# Patient Record
Sex: Female | Born: 1950 | Race: White | Hispanic: No | Marital: Married | Smoking: Former smoker
Health system: Southern US, Community
[De-identification: ages and names within clinical notes are randomized; demographics above are authoritative.]

## PROBLEM LIST (undated history)

## (undated) DIAGNOSIS — I10 Essential (primary) hypertension: Secondary | ICD-10-CM

## (undated) DIAGNOSIS — R011 Cardiac murmur, unspecified: Secondary | ICD-10-CM

## (undated) DIAGNOSIS — Z8489 Family history of other specified conditions: Secondary | ICD-10-CM

## (undated) DIAGNOSIS — G709 Myoneural disorder, unspecified: Secondary | ICD-10-CM

## (undated) DIAGNOSIS — R51 Headache: Secondary | ICD-10-CM

## (undated) DIAGNOSIS — Z87442 Personal history of urinary calculi: Secondary | ICD-10-CM

## (undated) DIAGNOSIS — E785 Hyperlipidemia, unspecified: Secondary | ICD-10-CM

## (undated) HISTORY — PX: INCONTINENCE SURGERY: SHX676

## (undated) HISTORY — DX: Essential (primary) hypertension: I10

## (undated) HISTORY — DX: Cardiac murmur, unspecified: R01.1

## (undated) HISTORY — PX: TUBAL LIGATION: SHX77

## (undated) HISTORY — PX: UPPER GI ENDOSCOPY: SHX6162

## (undated) HISTORY — PX: FOOT SURGERY: SHX648

## (undated) HISTORY — DX: Hyperlipidemia, unspecified: E78.5

---

## 1998-02-01 ENCOUNTER — Other Ambulatory Visit: Admission: RE | Admit: 1998-02-01 | Discharge: 1998-02-01 | Payer: Self-pay | Admitting: Obstetrics & Gynecology

## 1999-04-04 ENCOUNTER — Other Ambulatory Visit: Admission: RE | Admit: 1999-04-04 | Discharge: 1999-04-04 | Payer: Self-pay | Admitting: Obstetrics & Gynecology

## 2000-06-15 ENCOUNTER — Other Ambulatory Visit: Admission: RE | Admit: 2000-06-15 | Discharge: 2000-06-15 | Payer: Self-pay | Admitting: Obstetrics & Gynecology

## 2001-12-28 ENCOUNTER — Encounter (HOSPITAL_BASED_OUTPATIENT_CLINIC_OR_DEPARTMENT_OTHER): Payer: Self-pay | Admitting: Internal Medicine

## 2001-12-28 ENCOUNTER — Ambulatory Visit (HOSPITAL_COMMUNITY): Admission: RE | Admit: 2001-12-28 | Discharge: 2001-12-28 | Payer: Self-pay | Admitting: Internal Medicine

## 2002-01-05 ENCOUNTER — Encounter (INDEPENDENT_AMBULATORY_CARE_PROVIDER_SITE_OTHER): Payer: Self-pay | Admitting: *Deleted

## 2002-01-05 ENCOUNTER — Ambulatory Visit (HOSPITAL_COMMUNITY): Admission: RE | Admit: 2002-01-05 | Discharge: 2002-01-05 | Payer: Self-pay | Admitting: Internal Medicine

## 2002-01-05 ENCOUNTER — Encounter (HOSPITAL_BASED_OUTPATIENT_CLINIC_OR_DEPARTMENT_OTHER): Payer: Self-pay | Admitting: Internal Medicine

## 2002-11-23 ENCOUNTER — Other Ambulatory Visit: Admission: RE | Admit: 2002-11-23 | Discharge: 2002-11-23 | Payer: Self-pay | Admitting: Obstetrics & Gynecology

## 2003-12-04 ENCOUNTER — Other Ambulatory Visit: Admission: RE | Admit: 2003-12-04 | Discharge: 2003-12-04 | Payer: Self-pay | Admitting: Obstetrics & Gynecology

## 2004-12-04 ENCOUNTER — Other Ambulatory Visit: Admission: RE | Admit: 2004-12-04 | Discharge: 2004-12-04 | Payer: Self-pay | Admitting: Obstetrics & Gynecology

## 2008-01-19 HISTORY — PX: CARDIOVASCULAR STRESS TEST: SHX262

## 2010-03-19 ENCOUNTER — Ambulatory Visit (HOSPITAL_COMMUNITY): Admission: RE | Admit: 2010-03-19 | Discharge: 2010-03-19 | Payer: Self-pay | Admitting: Obstetrics and Gynecology

## 2010-06-13 ENCOUNTER — Encounter: Admission: RE | Admit: 2010-06-13 | Discharge: 2010-06-13 | Payer: Self-pay | Admitting: Cardiovascular Disease

## 2010-10-25 LAB — BASIC METABOLIC PANEL
BUN: 16 mg/dL (ref 6–23)
CO2: 29 mEq/L (ref 19–32)
Calcium: 10.3 mg/dL (ref 8.4–10.5)
Chloride: 104 mEq/L (ref 96–112)
Creatinine, Ser: 0.56 mg/dL (ref 0.4–1.2)
GFR calc Af Amer: >60 mL/min (ref >60)
GFR calc non Af Amer: >60 mL/min (ref >60)
Glucose, Bld: 132 mg/dL — ABNORMAL HIGH (ref 70–99)
Potassium: 4.3 mEq/L (ref 3.5–5.1)
Sodium: 138 mEq/L (ref 135–145)

## 2010-10-25 LAB — CBC
HCT: 43 % (ref 36.0–46.0)
Hemoglobin: 14.1 g/dL (ref 12.0–15.0)
MCH: 30.5 pg (ref 26.0–34.0)
MCHC: 32.9 g/dL (ref 30.0–36.0)
MCV: 92.7 fL (ref 78.0–100.0)
Platelets: 359 10*3/uL (ref 150–400)
RBC: 4.64 MIL/uL (ref 3.87–5.11)
RDW: 14.4 % (ref 11.5–15.5)
WBC: 8.6 10*3/uL (ref 4.0–10.5)

## 2010-10-25 LAB — URINALYSIS, ROUTINE W REFLEX MICROSCOPIC
Bilirubin Urine: NEGATIVE
Glucose, UA: NEGATIVE mg/dL
Hgb urine dipstick: NEGATIVE
Ketones, ur: NEGATIVE mg/dL
Nitrite: NEGATIVE
Protein, ur: NEGATIVE mg/dL
Specific Gravity, Urine: 1.01 (ref 1.005–1.030)
Urobilinogen, UA: 0.2 mg/dL (ref 0.0–1.0)
pH: 6 (ref 5.0–8.0)

## 2010-10-25 LAB — SURGICAL PCR SCREEN: MRSA, PCR: NEGATIVE

## 2011-07-01 ENCOUNTER — Other Ambulatory Visit: Payer: Self-pay | Admitting: Internal Medicine

## 2011-07-02 ENCOUNTER — Ambulatory Visit
Admission: RE | Admit: 2011-07-02 | Discharge: 2011-07-02 | Disposition: A | Discharge status: Home / Self Care | Payer: Commercial Managed Care - PPO | Source: Ambulatory Visit | Attending: Internal Medicine | Admitting: Internal Medicine

## 2012-05-27 HISTORY — PX: TRANSTHORACIC ECHOCARDIOGRAM: SHX275

## 2013-05-25 ENCOUNTER — Ambulatory Visit: Payer: Commercial Managed Care - PPO | Admitting: Cardiovascular Disease

## 2013-05-31 ENCOUNTER — Encounter: Payer: Self-pay | Admitting: Cardiovascular Disease

## 2013-05-31 ENCOUNTER — Ambulatory Visit (INDEPENDENT_AMBULATORY_CARE_PROVIDER_SITE_OTHER): Payer: Commercial Managed Care - PPO | Admitting: Cardiovascular Disease

## 2013-05-31 VITALS — BP 130/90 | Ht 64.5 in | Wt 167.3 lb

## 2013-05-31 DIAGNOSIS — I2510 Atherosclerotic heart disease of native coronary artery without angina pectoris: Secondary | ICD-10-CM

## 2013-05-31 DIAGNOSIS — I10 Essential (primary) hypertension: Secondary | ICD-10-CM

## 2013-05-31 DIAGNOSIS — E8881 Metabolic syndrome: Secondary | ICD-10-CM

## 2013-05-31 DIAGNOSIS — E785 Hyperlipidemia, unspecified: Secondary | ICD-10-CM

## 2013-05-31 DIAGNOSIS — K219 Gastro-esophageal reflux disease without esophagitis: Secondary | ICD-10-CM

## 2013-05-31 NOTE — Patient Instructions (Signed)
Your physician recommends that you return for lab work fasting.  Your physician recommends that you schedule a follow-up appointment in:6 MONTHS. 

## 2013-06-01 ENCOUNTER — Encounter: Payer: Self-pay | Admitting: Cardiovascular Disease

## 2013-06-01 LAB — COMPREHENSIVE METABOLIC PANEL
AST: 19 U/L (ref 0–37)
Albumin: 4.4 g/dL (ref 3.5–5.2)
Alkaline Phosphatase: 81 U/L (ref 39–117)
BUN: 12 mg/dL (ref 6–23)
Potassium: 4.5 mEq/L (ref 3.5–5.3)
Total Bilirubin: 0.6 mg/dL (ref 0.3–1.2)

## 2013-06-01 LAB — NMR LIPOPROFILE WITH LIPIDS
HDL Size: 8.3 nm — ABNORMAL LOW (ref >=9.2)
HDL-C: 43 mg/dL (ref >=40)
LDL Particle Number: 1784 nmol/L — ABNORMAL HIGH (ref <1000)
LDL Size: 20 nm — ABNORMAL LOW (ref >20.5)
Large VLDL-P: 8 nmol/L — ABNORMAL HIGH (ref <=2.7)
Triglycerides: 208 mg/dL — ABNORMAL HIGH (ref <150)
VLDL Size: 51.6 nm — ABNORMAL HIGH (ref <=46.6)

## 2013-06-01 MED ORDER — PANTOPRAZOLE SODIUM 40 MG PO TBEC: 40.0000 mg | DELAYED_RELEASE_TABLET | Freq: Every day | ORAL | Status: DC

## 2013-06-01 MED ORDER — VALSARTAN 80 MG PO TABS: 80.0000 mg | ORAL_TABLET | Freq: Every day | ORAL | Status: DC

## 2013-06-01 MED ORDER — ATORVASTATIN CALCIUM 20 MG PO TABS: 20.0000 mg | ORAL_TABLET | Freq: Every day | ORAL | Status: DC

## 2013-06-06 ENCOUNTER — Encounter: Payer: Self-pay | Admitting: Cardiovascular Disease

## 2013-06-16 ENCOUNTER — Other Ambulatory Visit: Payer: Self-pay

## 2013-06-27 ENCOUNTER — Encounter: Payer: Self-pay | Admitting: Cardiovascular Disease

## 2013-06-27 DIAGNOSIS — I10 Essential (primary) hypertension: Secondary | ICD-10-CM | POA: Insufficient documentation

## 2013-06-27 DIAGNOSIS — E8881 Metabolic syndrome: Secondary | ICD-10-CM | POA: Insufficient documentation

## 2013-06-27 DIAGNOSIS — E785 Hyperlipidemia, unspecified: Secondary | ICD-10-CM | POA: Insufficient documentation

## 2013-06-27 DIAGNOSIS — K219 Gastro-esophageal reflux disease without esophagitis: Secondary | ICD-10-CM | POA: Insufficient documentation

## 2013-06-27 NOTE — Progress Notes (Signed)
Patient ID: Chelsea Mays, female   DOB: September 12, 1950, 62 y.o.   MRN: [id number]     PATIENT PROFILE:  [name] is a 62 year old presents to the office to establish cardiological care with me. She is a  former patient of Dr. Susa Mays and the wife of Mr. Chelsea Mays.   HPI: [name] has a history of hyperlipidemia, mild hypertension, and possible metabolic syndrome. She sees Dr. Brunilda Mays for primary medical care. In 2009 she underwent a nuclear perfusion study to evaluate for ischemia which was having atypical chest pain. This revealed normal perfusion. A 2-D echo Doppler study in October 2013 showed normal left ventricular size and low normal function with an ejection fraction of 50-55%. She had grade 1 diastolic dysfunction. There was evidence for mild mitral annular calcification with mild mitral regurgitation. Aortic valve is mildly sclerotic but there was no evidence for stenosis. She also has a history of GERD. She last saw Dr. Alanda Mays in April 2014. Laboratory data time showed a glucose of 110. Abnormal LFTs. Calcium was upper normal at 10.3. In October 2013, her lipids were notable for a total cholesterol that was 176, triglycerides were elevated at 181, HDL 41, and LDL cholesterol 99.  She denies any recent chest pain. She denies any presyncope or palpitations. She presents to the office today to establish cardiologic care with me.  Past Medical History  Diagnosis Date  . Hyperlipidemia   . Hypertension   . Murmur     Past Surgical History  Procedure Laterality Date  . Cardiovascular stress test  01/19/2008    Non specific T-wave changes, normal scan  . Transthoracic echocardiogram  05/27/2012    EF 50-55%, normal-mild    No Known Allergies  Current Outpatient Prescriptions  Medication Sig Dispense Refill  . aspirin EC 81 MG tablet Take 81 mg by mouth daily.      Marland Kitchen loratadine (CLARITIN) 10 MG tablet Take 10 mg by mouth as needed for allergies.        . Multiple Vitamin (MULTIVITAMIN) tablet Take 1 tablet by mouth daily.      . sertraline (ZOLOFT) 25 MG tablet Take 25 mg by mouth daily.      Marland Kitchen atorvastatin (LIPITOR) 20 MG tablet Take 1 tablet (20 mg total) by mouth daily.  90 tablet  3  . pantoprazole (PROTONIX) 40 MG tablet Take 1 tablet (40 mg total) by mouth daily.  90 tablet  3  . valsartan (DIOVAN) 80 MG tablet Take 1 tablet (80 mg total) by mouth daily.  90 tablet  3   No current facility-administered medications for this visit.    Social history is notable in that she is married for 31 years. She has one child 2 grandchildren. 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 There is a remote tobacco history she quit in August 2005. She does drink approximately 3-5 drinks of wine per week. She does exercise fairly regularly typically doing walking and yard work.  Family History  Problem Relation Age of Onset  . Alzheimer's disease Mother 78  . Heart attack Father 58  . Thyroid disease Brother     ROS is negative for fever chills or night sweats. She denies any skin rash or changes. She denies visual symptoms. She denies cough or sputum production. She denies palpitations presyncope or syncope. There is no chest pressure. She does have a history of mild GERD. She denies nausea vomiting diarrhea.  She denies blood in her stool or urine. There is no endocrine abnormalities. She's unaware of diabetes or thyroid disease. She denies claudication. There is no edema. She denies neurologic symptoms. Other comprehensive 14 point system review is negative.  PE BP 130/90  Ht 5' 4.5" (1.638 m)  Wt 167 lb 4.8 oz (75.887 kg)  BMI 28.28 kg/m2 Repeat blood pressure by me was 126/85. General: Alert, oriented, no distress.  Skin: normal turgor, no rashes; left arm tattoo and a tattoo above her right groin region. HEENT: Normocephalic, atraumatic. Pupils round and reactive; sclera anicteric; Fundi without hemorrhages  or exudates the Nose without nasal septal hypertrophy Mouth/Parynx benign; Mallinpatti scale 3 Neck: No JVD, no carotid bruits Lungs: clear to ausculatation and percussion; no wheezing or rales Heart: RRR, s1 s2 normal 1/6 systolic murmur. There was no rub or click. Abdomen: soft, nontender; no hepatosplenomehaly, BS+; abdominal aorta nontender and not dilated by palpation. Pulses 2+ Extremities: no clubbinbg cyanosis or edema, Homan's sign negative  Neurologic: grossly nonfocal Psychologic: Normal mood and affect   ECG: Normal sinus rhythm at 63 beats per minute period. We'll 158 ms, QTc interval 444 ms. No significant ST changes.  LABS:  BMET    Component Value Date/Time   NA 139 05/31/2013 1117   K 4.5 05/31/2013 1117   CL 104 05/31/2013 1117   CO2 27 05/31/2013 1117   GLUCOSE 101* 05/31/2013 1117   BUN 12 05/31/2013 1117   CREATININE 0.60 05/31/2013 1117   CREATININE 0.56 03/13/2010 0925   CALCIUM 10.0 05/31/2013 1117   GFRNONAA >60 03/13/2010 0925   GFRAA  Value: >60        The eGFR has been calculated using the MDRD equation. This calculation has not been validated in all clinical situations. eGFR's persistently <60 mL/min signify possible Chronic Kidney Disease. 03/13/2010 0925     Hepatic Function Panel     Component Value Date/Time   PROT 7.4 05/31/2013 1117   ALBUMIN 4.4 05/31/2013 1117   AST 19 05/31/2013 1117   ALT 18 05/31/2013 1117   ALKPHOS 81 05/31/2013 1117   BILITOT 0.6 05/31/2013 1117     CBC    Component Value Date/Time   WBC 8.6 03/13/2010 0925   RBC 4.64 03/13/2010 0925   HGB 14.1 03/13/2010 0925   HCT 43.0 03/13/2010 0925   PLT 359 03/13/2010 0925   MCV 92.7 03/13/2010 0925   MCH 30.5 03/13/2010 0925   MCHC 32.9 03/13/2010 0925   RDW 14.4 03/13/2010 0925     BNP No results found for this basename: probnp    Lipid Panel     Component Value Date/Time   TRIG 208* 05/31/2013 1117   LDLCALC 86 05/31/2013 1117     RADIOLOGY: No results  found.   ASSESSMENT AND PLAN: My impression is that [name] is a 62 year old female who has a remote tobacco history but quit smoking 9 years ago. Her blood pressure today is controlled on Diovan 80 mg daily. She is on atorvastatin 20 mg for hyperlipidemia. She's not had any significant GERD symptoms on her current dose of Protonix. Nuclear perfusion study in 2009 showed normal perfusion without evidence for scar or ischemia. Her most recent echo Doppler study one year ago did show low normal systolic function with grade 1 diastolic dysfunction. Presently, her blood pressure is well controlled. In 6 months she will undergo a progressive metabolic panel hemoglobin A1c and NMR lipoprofile for further evaluation of her laboratory and possible metabolic  syndrome. We did discuss the importance of exercise and diet.  Lennette Bihari, MD, [facility] 06/27/2013 7:19 PM

## 2013-06-28 ENCOUNTER — Encounter: Payer: Self-pay | Admitting: Cardiovascular Disease

## 2013-06-30 ENCOUNTER — Telehealth: Payer: Self-pay | Admitting: *Deleted

## 2013-06-30 MED ORDER — ATORVASTATIN CALCIUM 40 MG PO TABS: 40.0000 mg | ORAL_TABLET | Freq: Every day | ORAL | Status: DC

## 2013-06-30 NOTE — Telephone Encounter (Signed)
Informed patient of lab results. Dr. Tresa Endo wants her to increase her atorvastatin to 40 mg. Patient advised to take 2 of the 20 mg tablets until gone, then start the new prescription for the 40 mg tablets. New prescription will be sent to patient's prescription  mail order company per her request.

## 2013-11-01 ENCOUNTER — Other Ambulatory Visit: Payer: Self-pay | Admitting: Obstetrics & Gynecology

## 2014-01-16 ENCOUNTER — Other Ambulatory Visit: Payer: Self-pay | Admitting: Gastroenterology

## 2014-03-28 ENCOUNTER — Other Ambulatory Visit: Payer: Self-pay | Admitting: Cardiovascular Disease

## 2014-03-28 NOTE — Telephone Encounter (Signed)
Rx was sent to pharmacy electronically. 

## 2014-04-06 ENCOUNTER — Encounter (HOSPITAL_COMMUNITY): Payer: Self-pay | Admitting: Pharmacy Technician

## 2014-04-13 ENCOUNTER — Encounter (HOSPITAL_COMMUNITY): Payer: Self-pay | Admitting: *Deleted

## 2014-04-17 ENCOUNTER — Other Ambulatory Visit: Payer: Self-pay | Admitting: Cardiovascular Disease

## 2014-04-18 NOTE — Telephone Encounter (Signed)
Rx was sent to pharmacy electronically. 

## 2014-04-24 ENCOUNTER — Other Ambulatory Visit: Payer: Self-pay | Admitting: Gastroenterology

## 2014-04-25 ENCOUNTER — Ambulatory Visit (HOSPITAL_COMMUNITY)
Admission: RE | Admit: 2014-04-25 | Discharge: 2014-04-25 | Disposition: A | Discharge status: Home / Self Care | Payer: Commercial Managed Care - PPO | Source: Ambulatory Visit | Attending: Gastroenterology | Admitting: Gastroenterology

## 2014-04-25 ENCOUNTER — Ambulatory Visit (HOSPITAL_COMMUNITY): Payer: Commercial Managed Care - PPO | Admitting: Anesthesiology

## 2014-04-25 ENCOUNTER — Encounter (HOSPITAL_COMMUNITY): Payer: Commercial Managed Care - PPO | Admitting: Anesthesiology

## 2014-04-25 ENCOUNTER — Encounter (HOSPITAL_COMMUNITY)
Admission: RE | Disposition: A | Discharge status: Home / Self Care | Payer: Self-pay | Source: Ambulatory Visit | Attending: Gastroenterology

## 2014-04-25 ENCOUNTER — Encounter (HOSPITAL_COMMUNITY): Admission: RE | Payer: Self-pay | Source: Ambulatory Visit

## 2014-04-25 ENCOUNTER — Other Ambulatory Visit: Payer: Self-pay | Admitting: Gastroenterology

## 2014-04-25 ENCOUNTER — Ambulatory Visit (HOSPITAL_COMMUNITY)
Admission: RE | Admit: 2014-04-25 | Payer: Commercial Managed Care - PPO | Source: Ambulatory Visit | Admitting: Gastroenterology

## 2014-04-25 ENCOUNTER — Encounter (HOSPITAL_COMMUNITY): Payer: Self-pay | Admitting: *Deleted

## 2014-04-25 DIAGNOSIS — E7800 Pure hypercholesterolemia, unspecified: Secondary | ICD-10-CM | POA: Insufficient documentation

## 2014-04-25 DIAGNOSIS — Z87891 Personal history of nicotine dependence: Secondary | ICD-10-CM | POA: Insufficient documentation

## 2014-04-25 DIAGNOSIS — K5730 Diverticulosis of large intestine without perforation or abscess without bleeding: Secondary | ICD-10-CM | POA: Diagnosis not present

## 2014-04-25 DIAGNOSIS — I10 Essential (primary) hypertension: Secondary | ICD-10-CM | POA: Insufficient documentation

## 2014-04-25 DIAGNOSIS — K219 Gastro-esophageal reflux disease without esophagitis: Secondary | ICD-10-CM | POA: Insufficient documentation

## 2014-04-25 DIAGNOSIS — Z1211 Encounter for screening for malignant neoplasm of colon: Secondary | ICD-10-CM | POA: Diagnosis not present

## 2014-04-25 HISTORY — DX: Myoneural disorder, unspecified: G70.9

## 2014-04-25 HISTORY — DX: Personal history of urinary calculi: Z87.442

## 2014-04-25 HISTORY — DX: Headache: R51

## 2014-04-25 HISTORY — DX: Family history of other specified conditions: Z84.89

## 2014-04-25 HISTORY — PX: COLONOSCOPY WITH PROPOFOL: SHX5780

## 2014-04-25 SURGERY — COLONOSCOPY WITH PROPOFOL
Anesthesia: Monitor Anesthesia Care

## 2014-04-25 MED ORDER — PROPOFOL 10 MG/ML IV BOLUS
INTRAVENOUS | Status: AC
  Filled 2014-04-25: qty 20

## 2014-04-25 MED ORDER — ONDANSETRON HCL 4 MG/2ML IJ SOLN
INTRAMUSCULAR | Status: AC
  Filled 2014-04-25: qty 2

## 2014-04-25 MED ORDER — LACTATED RINGERS IV SOLN
INTRAVENOUS | Status: DC
  Administered 2014-04-25: 1000 mL via INTRAVENOUS

## 2014-04-25 MED ORDER — LIDOCAINE HCL (CARDIAC) 20 MG/ML IV SOLN
INTRAVENOUS | Status: DC | PRN
  Administered 2014-04-25: 100 mg via INTRAVENOUS

## 2014-04-25 MED ORDER — ONDANSETRON HCL 4 MG/2ML IJ SOLN
INTRAMUSCULAR | Status: DC | PRN
  Administered 2014-04-25: 4 mg via INTRAVENOUS

## 2014-04-25 MED ORDER — LIDOCAINE HCL (CARDIAC) 20 MG/ML IV SOLN
INTRAVENOUS | Status: AC
  Filled 2014-04-25: qty 5

## 2014-04-25 MED ORDER — PROPOFOL INFUSION 10 MG/ML OPTIME
INTRAVENOUS | Status: DC | PRN
  Administered 2014-04-25: 300 ug/kg/min via INTRAVENOUS

## 2014-04-25 MED ORDER — KETAMINE HCL 10 MG/ML IJ SOLN
INTRAMUSCULAR | Status: DC | PRN
  Administered 2014-04-25: 20 mg via INTRAVENOUS

## 2014-04-25 SURGICAL SUPPLY — 22 items

## 2014-04-25 NOTE — H&P (Signed)
  Procedure: Screening colonoscopy. Normal screening colonoscopy performed on 03/26/2006; colon prep for the exam was fair.  History: The patient is a 63 year old female born 04/15/1951. Her mother and sister have undergone colonoscopic exams to remove colon polyps. There is no family history of colon cancer.  The patient is scheduled to undergo a repeat screening colonoscopy.  Past medical history: Hypertension. Hypercholesterolemia. Seasonal allergies. Gastroesophageal reflux. Tubal ligation. Pelvic mesh-sling. Left foot surgery. Nasal fracture  Habits: The patient has a 10 year pack history of cigarette smoking. She quit smoking cigarettes 10 years ago. She consumes wine in moderation  Exam: The patient is alert and lying comfortably on the endoscopy stretcher. Abdomen is soft and nontender to palpation. Lungs are clear to auscultation. Cardiac exam reveals a regular rhythm.  Plan: Proceed with repeat screening colonoscopy

## 2014-04-25 NOTE — Anesthesia Preprocedure Evaluation (Signed)
Anesthesia Evaluation  Patient identified by MRN, date of birth, ID band Patient awake    Reviewed: Allergy & Precautions, H&P , NPO status , Patient's Chart, lab work & pertinent test results  Airway Mallampati: II TM Distance: >3 FB Neck ROM: Full    Dental no notable dental hx.    Pulmonary former smoker,  breath sounds clear to auscultation  Pulmonary exam normal       Cardiovascular hypertension, Pt. on medications + Valvular Problems/Murmurs Rhythm:Regular Rate:Normal     Neuro/Psych  Headaches, negative psych ROS   GI/Hepatic Neg liver ROS, GERD-  ,  Endo/Other  negative endocrine ROS  Renal/GU negative Renal ROS     Musculoskeletal negative musculoskeletal ROS (+)   Abdominal   Peds  Hematology negative hematology ROS (+)   Anesthesia Other Findings   Reproductive/Obstetrics negative OB ROS                           Anesthesia Physical Anesthesia Plan  ASA: II  Anesthesia Plan: MAC   Post-op Pain Management:    Induction: Intravenous  Airway Management Planned:   Additional Equipment:   Intra-op Plan:   Post-operative Plan:   Informed Consent: I have reviewed the patients History and Physical, chart, labs and discussed the procedure including the risks, benefits and alternatives for the proposed anesthesia with the patient or authorized representative who has indicated his/her understanding and acceptance.   Dental advisory given  Plan Discussed with: CRNA  Anesthesia Plan Comments:         Anesthesia Quick Evaluation

## 2014-04-25 NOTE — Op Note (Signed)
Procedure: Screening colonoscopy. Normal screening colonoscopy performed on 03/26/2006; fair colon prep for the exam.  Endoscopist: Danise Edge  Premedication: Propofol administered by anesthesia  Procedure: The patient was placed in the left lateral decubitus position. Anal inspection and digital rectal exam were normal. The Pentax pediatric colonoscope was introduced into the rectum and advanced to the cecum. A normal-appearing appendiceal orifice was identified. A normal-appearing ileocecal valve was identified. Colonic preparation for the exam today was good. Performance of today's colonoscopy was technically difficult due to colonic loop formation.  Rectum. Normal. Retroflexed view of the distal rectum normal  Sigmoid colon and descending colon. Normal  Splenic flexure. Normal  Transverse colon. Normal  Hepatic flexure. Normal  Ascending colon. Normal  Cecum and ileocecal valve. Normal  Assessment: Normal screening colonoscopy  Recommendation: Schedule repeat screening colonoscopy in 10 years

## 2014-04-25 NOTE — Transfer of Care (Signed)
Immediate Anesthesia Transfer of Care Note  Patient: Chelsea Mays  Procedure(s) Performed: Procedure(s) (LRB): COLONOSCOPY WITH PROPOFOL (N/A)  Patient Location: PACU  Anesthesia Type: MAC  Level of Consciousness: sedated, patient cooperative and responds to stimulation  Airway & Oxygen Therapy: Patient Spontanous Breathing and Patient connected to face mask oxgen  Post-op Assessment: Report given to PACU RN and Post -op Vital signs reviewed and stable  Post vital signs: Reviewed and stable  Complications: No apparent anesthesia complications

## 2014-04-25 NOTE — Anesthesia Postprocedure Evaluation (Signed)
Anesthesia Post Note  Patient: Chelsea Mays  Procedure(s) Performed: Procedure(s) (LRB): COLONOSCOPY WITH PROPOFOL (N/A)  Anesthesia type: Epidural  Patient location: PACU  Post pain: Pain level controlled  Post assessment: Post-op Vital signs reviewed  Last Vitals: BP 128/68  Pulse 67  Temp(Src) 36.8 C (Oral)  Resp 13  Ht  (1.626 m)  Wt 165 lb (74.844 kg)  BMI 28.31 kg/m2  SpO2 96%  Post vital signs: Reviewed  Level of consciousness: sedated  Complications: No apparent anesthesia complications

## 2014-04-26 ENCOUNTER — Encounter (HOSPITAL_COMMUNITY): Payer: Self-pay | Admitting: Gastroenterology

## 2014-05-10 ENCOUNTER — Ambulatory Visit (INDEPENDENT_AMBULATORY_CARE_PROVIDER_SITE_OTHER): Payer: Commercial Managed Care - PPO | Admitting: Cardiovascular Disease

## 2014-05-10 ENCOUNTER — Telehealth: Payer: Self-pay | Admitting: Cardiovascular Disease

## 2014-05-10 ENCOUNTER — Encounter: Payer: Self-pay | Admitting: Cardiovascular Disease

## 2014-05-10 VITALS — BP 124/88 | HR 64 | Ht 64.0 in | Wt 169.9 lb

## 2014-05-10 DIAGNOSIS — I10 Essential (primary) hypertension: Secondary | ICD-10-CM

## 2014-05-10 DIAGNOSIS — E8881 Metabolic syndrome: Secondary | ICD-10-CM

## 2014-05-10 DIAGNOSIS — K219 Gastro-esophageal reflux disease without esophagitis: Secondary | ICD-10-CM

## 2014-05-10 DIAGNOSIS — E785 Hyperlipidemia, unspecified: Secondary | ICD-10-CM

## 2014-05-10 LAB — LIPID PANEL
CHOL/HDL RATIO: 3.4 ratio
CHOLESTEROL: 141 mg/dL (ref 0–200)
HDL: 42 mg/dL (ref >39)
LDL Cholesterol: 72 mg/dL (ref 0–99)
Triglycerides: 135 mg/dL (ref <150)
VLDL: 27 mg/dL (ref 0–40)

## 2014-05-10 LAB — COMPREHENSIVE METABOLIC PANEL
ALT: 18 U/L (ref 0–35)
AST: 18 U/L (ref 0–37)
Albumin: 4.1 g/dL (ref 3.5–5.2)
Alkaline Phosphatase: 76 U/L (ref 39–117)
BILIRUBIN TOTAL: 0.6 mg/dL (ref 0.2–1.2)
BUN: 15 mg/dL (ref 6–23)
CALCIUM: 9.5 mg/dL (ref 8.4–10.5)
CHLORIDE: 106 meq/L (ref 96–112)
CO2: 24 meq/L (ref 19–32)
Creat: 0.59 mg/dL (ref 0.50–1.10)
GLUCOSE: 105 mg/dL — AB (ref 70–99)
Potassium: 4.7 mEq/L (ref 3.5–5.3)
Sodium: 141 mEq/L (ref 135–145)
Total Protein: 6.7 g/dL (ref 6.0–8.3)

## 2014-05-10 MED ORDER — PANTOPRAZOLE SODIUM 40 MG PO TBEC: 40.0000 mg | DELAYED_RELEASE_TABLET | Freq: Two times a day (BID) | ORAL | Status: AC

## 2014-05-10 MED ORDER — PANTOPRAZOLE SODIUM 40 MG PO TBEC: 40.0000 mg | DELAYED_RELEASE_TABLET | Freq: Two times a day (BID) | ORAL | Status: DC

## 2014-05-10 MED ORDER — PANTOPRAZOLE SODIUM 40 MG PO TBEC: 40.0000 mg | DELAYED_RELEASE_TABLET | Freq: Every day | ORAL | Status: DC

## 2014-05-10 NOTE — Progress Notes (Signed)
Patient ID: Chelsea Mays, female   DOB: [birth year], 63 y.o.   MRN: [id number]     HPI:  Chelsea Mays is a 63 year old presents to the office for one year followup cardiology evaluation.  She is a former patient of Dr. Rollene Fare.   [name] has a history of hyperlipidemia, mild hypertension, and possible metabolic syndrome. She has seen Dr. Valetta Fuller for primary medical care , but has not seen him in 3 years. In 2009 she underwent a nuclear perfusion study to evaluate for ischemia which was having atypical chest pain. This revealed normal perfusion. A 2-D echo Doppler study in October 2013 showed normal left ventricular size and low normal function with an ejection fraction of 50-55%. She had grade 1 diastolic dysfunction. There was evidence for mild mitral annular calcification with mild mitral regurgitation. Aortic valve is mildly sclerotic but there was no evidence for stenosis. She also has a history of GERD. She last saw Dr. Rollene Fare in April 2014. Laboratory data time showed a glucose of 110. Abnormal LFTs. Calcium was upper normal at 10.3. In October 2013, her lipids were notable for a total cholesterol that was 176, triglycerides were elevated at 181, HDL 41, and LDL cholesterol 99.  She established care with me one year ago.  The past year, she states her blood pressure has been stable on valsartan 80 mg daily.  She does have issues with GERD and has been taking Protonix 40 mg daily.  Recently, however, she has noticed some increased reflux symptomatology.  She underwent a colonoscopy approximately 2-3 weeks ago by Dr. Howell Rucks was told that this was normal.  Apparently, their office, and stated that if she needs an evaluation with Dr. Wynetta Emery.  Reflux.  She will need a referral.  She also notes some mild varicose veins.  She denies leg swelling.  She denies skin discoloration.  She denies lower extremity edema.  She does have a history of hyperlipidemia and has been taking  atorvastatin 40 mg daily.  Past Medical History  Diagnosis Date  . Hyperlipidemia   . Hypertension   . Murmur   . Family history of anesthesia complication     mother had hard time awaking, not patient  . History of kidney stones   . Headache(784.0)     migraines in past  . Neuromuscular disorder     past hx. sciatic nerve pain , not at present    Past Surgical History  Procedure Laterality Date  . Cardiovascular stress test  01/19/2008    Non specific T-wave changes, normal scan  . Transthoracic echocardiogram  05/27/2012    EF 50-55%, normal-mild  . Upper gi endoscopy    . Foot surgery Left     x3 different times  . Tubal ligation    . Incontinence surgery    . Colonoscopy with propofol N/A 04/25/2014    Procedure: COLONOSCOPY WITH PROPOFOL;  Surgeon: Garlan Fair, MD;  Location: [facility];  Service: Endoscopy;  Laterality: N/A;    No Known Allergies  Current Outpatient Prescriptions  Medication Sig Dispense Refill  . aspirin EC 81 MG tablet Take 81 mg by mouth daily.      Marland Kitchen atorvastatin (LIPITOR) 40 MG tablet Take 40 mg by mouth daily.      . Multiple Vitamin (MULTIVITAMIN) tablet Take 1 tablet by mouth daily.      . pantoprazole (PROTONIX) 40 MG tablet Take 40 mg by mouth daily.      Marland Kitchen  valsartan (DIOVAN) 80 MG tablet Take 1 tablet (80 mg total) by mouth daily. NEEDS APPOINTMENT FOR FUTURE REFILLS.  90 tablet  0   No current facility-administered medications for this visit.    Social history is notable in that she is married for 31 years. She has one child 2 grandchildren. 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 There is a remote tobacco history she quit in August 2005. She does drink approximately 3-5 drinks of wine per week. She does exercise fairly regularly typically doing walking and yard work.  Family History  Problem Relation Age of Onset  . Alzheimer's disease Mother 32  . Heart attack Father 53  . Thyroid  disease Brother     ROS General: Negative; No fevers, chills, or night sweats;  HEENT: Negative; No changes in vision or hearing, sinus congestion, difficulty swallowing Pulmonary: Negative; No cough, wheezing, shortness of breath, hemoptysis Cardiovascular: Negative; No chest pain, presyncope, syncope, palpitations GI: Positive for GERD No nausea, vomiting, diarrhea, or abdominal pain GU: Negative; No dysuria, hematuria, or difficulty voiding Musculoskeletal: Negative; no myalgias, joint pain, or weakness Hematologic/Oncology: Negative; no easy bruising, bleeding Endocrine: Negative; no heat/cold intolerance; no diabetes Neuro: Negative; no changes in balance, headaches Skin: Negative; No rashes or skin lesions Psychiatric: Negative; No behavioral problems, depression Sleep: Negative; No snoring, daytime sleepiness, hypersomnolence, bruxism, restless legs, hypnogognic hallucinations, no cataplexy Other comprehensive 14 point system review is negative.   PE BP 124/88  Pulse 64  Ht $R'5\' 4"'Qa$  (1.626 m)  Wt 169 lb 14.4 oz (77.066 kg)  BMI 29.15 kg/m2 Repeat blood pressure by me was 126/85. General: Alert, oriented, no distress.  Skin: normal turgor, no rashes; left arm tattoo and a tattoo above her right groin region. HEENT: Normocephalic, atraumatic. Pupils round and reactive; sclera anicteric; Fundi without hemorrhages or exudates  Nose without nasal septal hypertrophy Mouth/Parynx benign; Mallinpatti scale 3 Neck: No JVD, no carotid bruits; normal carotid upstroke Lungs: clear to ausculatation and percussion; no wheezing or rales Chest wall: Nontender to palpation Heart: RRR, s1 s2 normal 1/6 systolic murmur. There was no rub or click.  No diastolic murmur. Abdomen: soft, nontender; no hepatosplenomehaly, BS+; abdominal aorta nontender and not dilated by palpation. Back: No CVA tenderness Pulses 2+ Extremities: no clubbinbg cyanosis or edema, Homan's sign negative  Neurologic:  grossly nonfocal Psychologic: Normal mood and affect  ECG (independently read by me): Normal sinus rhythm at 64 beats per minute.  Normal intervals.  No ST changes.   October 2014 ECG: Normal sinus rhythm at 63 beats per minute period. We'll 158 ms, QTc interval 444 ms. No significant ST changes.  LABS:  BMET    Component Value Date/Time   NA 139 05/31/2013 1117   K 4.5 05/31/2013 1117   CL 104 05/31/2013 1117   CO2 27 05/31/2013 1117   GLUCOSE 101* 05/31/2013 1117   BUN 12 05/31/2013 1117   CREATININE 0.60 05/31/2013 1117   CREATININE 0.56 03/13/2010 0925   CALCIUM 10.0 05/31/2013 1117   GFRNONAA >60 03/13/2010 0925   GFRAA  Value: >60        The eGFR has been calculated using the MDRD equation. This calculation has not been validated in all clinical situations. eGFR's persistently <60 mL/min signify possible Chronic Kidney Disease. 03/13/2010 0925     Hepatic Function Panel     Component Value Date/Time   PROT 7.4 05/31/2013 1117   ALBUMIN 4.4 05/31/2013 1117  AST 19 05/31/2013 1117   ALT 18 05/31/2013 1117   ALKPHOS 81 05/31/2013 1117   BILITOT 0.6 05/31/2013 1117     CBC    Component Value Date/Time   WBC 8.6 03/13/2010 0925   RBC 4.64 03/13/2010 0925   HGB 14.1 03/13/2010 0925   HCT 43.0 03/13/2010 0925   PLT 359 03/13/2010 0925   MCV 92.7 03/13/2010 0925   MCH 30.5 03/13/2010 0925   MCHC 32.9 03/13/2010 0925   RDW 14.4 03/13/2010 0925     BNP No results found for this basename: probnp    Lipid Panel     Component Value Date/Time   TRIG 208* 05/31/2013 1117   LDLCALC 86 05/31/2013 1117     RADIOLOGY: No results found.   ASSESSMENT AND PLAN: Ms. Llorens is a 63 year old female who has a remote tobacco history but quit smoking 10 years ago. Her blood pressure today is controlled on Diovan 80 mg daily. She is on atorvastatin 40 mg for hyperlipidemia.  A nuclear perfusion study in 2009 showed normal perfusion without evidence for scar or ischemia. Her most recent echo  Doppler study 2 years ago showed low normal systolic function with grade 1 diastolic dysfunction.  Recently, she has noticed more GERD symptoms.  I recommended she increase her Protonix 40 mg to twice a day rather than daily.  We will refer her back to Dr. Howell Rucks for further GI evaluation if necessary.  She had a normal colonoscopy several weeks ago.  She is fasting today.  We will check laboratory to make certain she is appropriately treated with reference to her lipid management and is tolerating valsartan.  I discussed importance of exercise.  Her body mass index is 29.1.  I have recommended that she have a flu shot this year.  I will see her in one year for cardiology followup evaluation.   Troy Sine, MD, [facility] 05/10/2014 9:45 AM

## 2014-05-10 NOTE — Patient Instructions (Signed)
You have been referred to Dr. Danise EdgeMartin Johnson to evaluate for GERD.  Your physician has recommended you make the following change in your medication: increase the pantoprazole to twice daily until the GI referral.  Your physician wants you to follow-up in: 1 year. You will receive a reminder letter in the mail two months in advance. If you don't receive a letter, please call our office to schedule the follow-up appointment.

## 2014-05-10 NOTE — Telephone Encounter (Signed)
[facility] IN [facility] SYSTEM FROM TODAY'S OFFICE VISIT RN PLACED ORDER FOR CMP ,LIPID

## 2014-05-15 ENCOUNTER — Other Ambulatory Visit: Payer: Self-pay | Admitting: Cardiovascular Disease

## 2014-05-26 ENCOUNTER — Other Ambulatory Visit: Payer: Self-pay

## 2014-06-28 ENCOUNTER — Other Ambulatory Visit: Payer: Self-pay | Admitting: Cardiovascular Disease

## 2014-06-28 NOTE — Telephone Encounter (Signed)
Rx refill sent to patient pharmacy   

## 2014-09-21 ENCOUNTER — Other Ambulatory Visit: Payer: Self-pay | Admitting: Gastroenterology

## 2014-09-21 DIAGNOSIS — R12 Heartburn: Secondary | ICD-10-CM

## 2014-09-27 ENCOUNTER — Other Ambulatory Visit: Payer: Self-pay | Admitting: Gastroenterology

## 2014-09-27 ENCOUNTER — Ambulatory Visit
Admission: RE | Admit: 2014-09-27 | Discharge: 2014-09-27 | Disposition: A | Discharge status: Home / Self Care | Payer: Commercial Managed Care - PPO | Source: Ambulatory Visit | Attending: Gastroenterology | Admitting: Gastroenterology

## 2014-09-27 ENCOUNTER — Other Ambulatory Visit: Payer: Commercial Managed Care - PPO

## 2014-09-27 DIAGNOSIS — R12 Heartburn: Secondary | ICD-10-CM

## 2015-01-04 ENCOUNTER — Other Ambulatory Visit: Payer: Self-pay | Admitting: Cardiovascular Disease

## 2015-01-04 NOTE — Telephone Encounter (Signed)
Rx(s) sent to pharmacy electronically.  

## 2015-03-07 ENCOUNTER — Other Ambulatory Visit: Payer: Self-pay | Admitting: Cardiovascular Disease

## 2015-04-04 ENCOUNTER — Other Ambulatory Visit: Payer: Self-pay | Admitting: Cardiovascular Disease

## 2015-06-03 ENCOUNTER — Other Ambulatory Visit: Payer: Self-pay | Admitting: Cardiovascular Disease

## 2015-06-14 ENCOUNTER — Ambulatory Visit (INDEPENDENT_AMBULATORY_CARE_PROVIDER_SITE_OTHER): Payer: Commercial Managed Care - PPO | Admitting: Cardiovascular Disease

## 2015-06-14 VITALS — BP 140/90 | HR 72 | Ht 64.0 in | Wt 163.1 lb

## 2015-06-14 DIAGNOSIS — E785 Hyperlipidemia, unspecified: Secondary | ICD-10-CM

## 2015-06-14 DIAGNOSIS — K449 Diaphragmatic hernia without obstruction or gangrene: Secondary | ICD-10-CM

## 2015-06-14 DIAGNOSIS — K219 Gastro-esophageal reflux disease without esophagitis: Secondary | ICD-10-CM

## 2015-06-14 DIAGNOSIS — Z79899 Other long term (current) drug therapy: Secondary | ICD-10-CM | POA: Diagnosis not present

## 2015-06-14 DIAGNOSIS — E8881 Metabolic syndrome: Secondary | ICD-10-CM | POA: Diagnosis not present

## 2015-06-14 DIAGNOSIS — I10 Essential (primary) hypertension: Secondary | ICD-10-CM

## 2015-06-14 LAB — CBC
HEMATOCRIT: 42.1 % (ref 36.0–46.0)
HEMOGLOBIN: 14.2 g/dL (ref 12.0–15.0)
MCH: 30 pg (ref 26.0–34.0)
MCHC: 33.7 g/dL (ref 30.0–36.0)
MCV: 88.8 fL (ref 78.0–100.0)
MPV: 11 fL (ref 8.6–12.4)
Platelets: 369 10*3/uL (ref 150–400)
RBC: 4.74 MIL/uL (ref 3.87–5.11)
RDW: 13.8 % (ref 11.5–15.5)
WBC: 7.8 10*3/uL (ref 4.0–10.5)

## 2015-06-14 LAB — COMPREHENSIVE METABOLIC PANEL
ALBUMIN: 4.4 g/dL (ref 3.6–5.1)
ALT: 19 U/L (ref 6–29)
AST: 19 U/L (ref 10–35)
Alkaline Phosphatase: 87 U/L (ref 33–130)
BILIRUBIN TOTAL: 0.7 mg/dL (ref 0.2–1.2)
BUN: 14 mg/dL (ref 7–25)
CHLORIDE: 100 mmol/L (ref 98–110)
CO2: 26 mmol/L (ref 20–31)
Calcium: 10.3 mg/dL (ref 8.6–10.4)
Creat: 0.63 mg/dL (ref 0.50–0.99)
Glucose, Bld: 101 mg/dL — ABNORMAL HIGH (ref 65–99)
Potassium: 4.8 mmol/L (ref 3.5–5.3)
Sodium: 141 mmol/L (ref 135–146)
TOTAL PROTEIN: 7.1 g/dL (ref 6.1–8.1)

## 2015-06-14 LAB — LIPID PANEL
Cholesterol: 135 mg/dL (ref 125–200)
HDL: 37 mg/dL — ABNORMAL LOW (ref >=46)
LDL Cholesterol: 67 mg/dL (ref <130)
TRIGLYCERIDES: 157 mg/dL — AB (ref <150)
Total CHOL/HDL Ratio: 3.6 Ratio (ref <=5.0)
VLDL: 31 mg/dL — AB (ref <30)

## 2015-06-14 LAB — HEMOGLOBIN A1C
Hgb A1c MFr Bld: 6.8 % — ABNORMAL HIGH (ref <5.7)
Mean Plasma Glucose: 148 mg/dL — ABNORMAL HIGH (ref <117)

## 2015-06-14 LAB — TSH: TSH: 5.552 u[IU]/mL — AB (ref 0.350–4.500)

## 2015-06-14 NOTE — Patient Instructions (Signed)
Your physician wants you to follow-up in: 1 year or sooner if needed. You will receive a reminder letter in the mail two months in advance. If you don't receive a letter, please call our office to schedule the follow-up appointment.  Your physician recommends that you return for lab work fasting.  If you need a refill on your cardiac medications before your next appointment, please call your pharmacy.

## 2015-06-15 ENCOUNTER — Encounter: Payer: Self-pay | Admitting: Cardiovascular Disease

## 2015-06-15 DIAGNOSIS — K449 Diaphragmatic hernia without obstruction or gangrene: Secondary | ICD-10-CM | POA: Insufficient documentation

## 2015-06-15 NOTE — Progress Notes (Signed)
Patient ID: Chelsea Mays, female   DOB: 11-Jan-1951, 64 y.o.   MRN: [id number]     HPI:  Chelsea Mays is a 64 year old presents to the office for one year followup cardiology evaluation.  She is a former patient of Dr. Rollene Fare.  Chelsea Mays has a history of hyperlipidemia, mild hypertension, and possible metabolic syndrome. She has seen Dr. Valetta Fuller for primary medical care , but has not seen him in 3 years. In 2009 she underwent a nuclear perfusion study to evaluate for ischemia which was having atypical chest pain. This revealed normal perfusion. A 2-D echo Doppler study in October 2013 showed normal left ventricular size and low normal function with an ejection fraction of 50-55%. She had grade 1 diastolic dysfunction. There was evidence for mild mitral annular calcification with mild mitral regurgitation. Aortic valve is mildly sclerotic but there was no evidence for stenosis. She also has a history of GERD. She last saw Dr. Rollene Fare in April 2014.  She has a history of hyperlipidemia and has been on atorvastatin 40 daily.  Since I last saw her past year, she states her blood pressure has been stable on valsartan 80 mg daily.  She has  GERD and has been taking Protonix 40 mg daily.   She underwent an upper GI and colonoscopy in August 2016 and was told of having a small hiatal hernia..  She also had an episode of shingles.She also notes some mild varicose veins.  She denies leg swelling.  She denies skin discoloration.  She denies lower extremity edema. She presents for evaluation   Past Medical History  Diagnosis Date  . Hyperlipidemia   . Hypertension   . Murmur   . Family history of anesthesia complication     mother had hard time awaking, not patient  . History of kidney stones   . Headache(784.0)     migraines in past  . Neuromuscular disorder ([place])     past hx. sciatic nerve pain , not at present    Past Surgical History  Procedure Laterality Date  . Cardiovascular  stress test  01/19/2008    Non specific T-wave changes, normal scan  . Transthoracic echocardiogram  05/27/2012    EF 50-55%, normal-mild  . Upper gi endoscopy    . Foot surgery Left     x3 different times  . Tubal ligation    . Incontinence surgery    . Colonoscopy with propofol N/A 04/25/2014    Procedure: COLONOSCOPY WITH PROPOFOL;  Surgeon: Garlan Fair, MD;  Location: [facility];  Service: Endoscopy;  Laterality: N/A;    No Known Allergies  Current Outpatient Prescriptions  Medication Sig Dispense Refill  . aspirin EC 81 MG tablet Take 81 mg by mouth daily.    Marland Kitchen atorvastatin (LIPITOR) 40 MG tablet Take 1 tablet (40 mg total) by mouth daily. 90 tablet 1  . Multiple Vitamin (MULTIVITAMIN) tablet Take 1 tablet by mouth daily.    . pantoprazole (PROTONIX) 40 MG tablet Take 1 tablet (40 mg total) by mouth 2 (two) times daily. 180 tablet 3  . valsartan (DIOVAN) 80 MG tablet Take 1 tablet (80 mg total) by mouth daily. 90 tablet 0   No current facility-administered medications for this visit.    Social history is notable in that she is married for 31 years. She has one child 2 grandchildren. 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 There is a remote tobacco history  she quit in August 2005. She does drink approximately 3-5 drinks of wine per week. She does exercise fairly regularly typically doing walking and yard work.  Family History  Problem Relation Age of Onset  . Alzheimer's disease Mother 79  . Heart attack Father 22  . Thyroid disease Brother     ROS General: Negative; No fevers, chills, or night sweats;  HEENT: Negative; No changes in vision or hearing, sinus congestion, difficulty swallowing Pulmonary: Negative; No cough, wheezing, shortness of breath, hemoptysis Cardiovascular: Negative; No chest pain, presyncope, syncope, palpitations GI: Positive for GERD No nausea, vomiting, diarrhea, or abdominal pain GU: Negative; No  dysuria, hematuria, or difficulty voiding Musculoskeletal: Negative; no myalgias, joint pain, or weakness Hematologic/Oncology: Negative; no easy bruising, bleeding Endocrine: Negative; no heat/cold intolerance; no diabetes Neuro: Negative; no changes in balance, headaches Skin: Negative; No rashes or skin lesions Psychiatric: Negative; No behavioral problems, depression Sleep: Negative; No snoring, daytime sleepiness, hypersomnolence, bruxism, restless legs, hypnogognic hallucinations, no cataplexy Other comprehensive 14 point system review is negative.   PE BP 140/90 mmHg  Pulse 72  Ht $R'5\' 4"'wb$  (1.626 m)  Wt 163 lb 1.6 oz (73.982 kg)  BMI 27.98 kg/m2 Repeat blood pressure by me was 124/80.  Wt Readings from Last 3 Encounters:  06/14/15 163 lb 1.6 oz (73.982 kg)  05/10/14 169 lb 14.4 oz (77.066 kg)  04/25/14 165 lb (74.844 kg)   General: Alert, oriented, no distress.  Skin: normal turgor, no rashes; left arm tattoo and a tattoo above her right groin region. HEENT: Normocephalic, atraumatic. Pupils round and reactive; sclera anicteric; Fundi without hemorrhages or exudates  Nose without nasal septal hypertrophy Mouth/Parynx benign; Mallinpatti scale 3 Neck: No JVD, no carotid bruits; normal carotid upstroke Lungs: clear to ausculatation and percussion; no wheezing or rales Chest wall: Nontender to palpation Heart: RRR, s1 s2 normal 1/6 systolic murmur. There was no rub or click.  No diastolic murmur. Abdomen: soft, nontender; no hepatosplenomehaly, BS+; abdominal aorta nontender and not dilated by palpation. Back: No CVA tenderness Pulses 2+ Extremities: no clubbinbg cyanosis or edema, Homan's sign negative  Neurologic: grossly nonfocal Psychologic: Normal mood and affect  ECG (independently read by me):  Normal sinus rhythm at 72 bpm.  September 2015 ECG (independently read by me): Normal sinus rhythm at 64 beats per minute.  Normal intervals.  No ST changes.  October 2014  ECG: Normal sinus rhythm at 63 beats per minute period. We'll 158 ms, QTc interval 444 ms. No significant ST changes.  LABS: BMP Latest Ref Rng 06/14/2015 05/10/2014 05/31/2013  Glucose 65 - 99 mg/dL 101(H) 105(H) 101(H)  BUN 7 - 25 mg/dL $Remove'14 15 12  'KYjiglP$ Creatinine 0.50 - 0.99 mg/dL 0.63 0.59 0.60  Sodium 135 - 146 mmol/L 141 141 139  Potassium 3.5 - 5.3 mmol/L 4.8 4.7 4.5  Chloride 98 - 110 mmol/L 100 106 104  CO2 20 - 31 mmol/L $RemoveB'26 24 27  'NwPttjCI$ Calcium 8.6 - 10.4 mg/dL 10.3 9.5 10.0   Hepatic Function Latest Ref Rng 06/14/2015 05/10/2014 05/31/2013  Total Protein 6.1 - 8.1 g/dL 7.1 6.7 7.4  Albumin 3.6 - 5.1 g/dL 4.4 4.1 4.4  AST 10 - 35 U/L $Remo'19 18 19  'dhmqh$ ALT 6 - 29 U/L $Remo'19 18 18  'outpj$ Alk Phosphatase 33 - 130 U/L 87 76 81  Total Bilirubin 0.2 - 1.2 mg/dL 0.7 0.6 0.6   CBC Latest Ref Rng 06/14/2015 03/13/2010  WBC 4.0 - 10.5 K/uL 7.8 8.6  Hemoglobin 12.0 - 15.0  g/dL 14.2 14.1  Hematocrit 36.0 - 46.0 % 42.1 43.0  Platelets 150 - 400 K/uL 369 359   Lab Results  Component Value Date   MCV 88.8 06/14/2015   MCV 92.7 03/13/2010   Lab Results  Component Value Date   TSH 5.552* 06/14/2015   Lab Results  Component Value Date   HGBA1C 6.8* 06/14/2015   Lipid Panel     Component Value Date/Time   CHOL 135 06/14/2015 1107   CHOL 171 05/31/2013 1117   TRIG 157* 06/14/2015 1107   TRIG 208* 05/31/2013 1117   HDL 37* 06/14/2015 1107   HDL 43 05/31/2013 1117   CHOLHDL 3.6 06/14/2015 1107   VLDL 31* 06/14/2015 1107   LDLCALC 67 06/14/2015 1107   LDLCALC 86 05/31/2013 1117   Additional laboratory from 11/06/2014 ordered by Dr. Huntley Dec was reviewed in detail and discussed with the patient.   RADIOLOGY: No results found.   ASSESSMENT AND PLAN: Ms. Hausmann is a 64 year old female who has a remote tobacco history but quit smoking 11 years ago. Her blood pressure today is controlled on valsartaqn 80 mg daily. She is on atorvastatin 40 mg for hyperlipidemia.  A nuclear perfusion study in 2009  showed normal perfusion without evidence for scar or ischemia. Her last echo Doppler study 3 years ago showed low normal systolic function with grade 1 diastolic dysfunction.   She has a history of hiatal hernia.  Her GERD is controlled with Protonix and she had been taking this twice a day but  Has self reduced this to 40 mg which he states is controlling her symptoms. I reviewed recent blood work that she had done earlier this year by Dr. Stann Mainland.  She has lost 11 pounds over the year.  Repeat blood work was sent today since the patient was fasting.  Her LDL cholesterol is now excellent at 67.  Hemoglobin A1c is increased.  I will refer this back to her primary physician as this may reflect development of early diabetes. I will see her in one year for reevaluation.  Time spent: 25 minutes  Troy Sine, MD, [facility] 06/15/2015 6:58 PM

## 2015-07-07 ENCOUNTER — Other Ambulatory Visit: Payer: Self-pay | Admitting: Cardiovascular Disease

## 2015-07-09 NOTE — Telephone Encounter (Signed)
REFILL 

## 2015-09-01 ENCOUNTER — Other Ambulatory Visit: Payer: Self-pay | Admitting: Cardiovascular Disease

## 2015-09-03 NOTE — Telephone Encounter (Signed)
REFILL 

## 2015-11-12 ENCOUNTER — Other Ambulatory Visit: Payer: Self-pay | Admitting: Obstetrics & Gynecology

## 2015-11-12 DIAGNOSIS — E2839 Other primary ovarian failure: Secondary | ICD-10-CM

## 2015-11-14 LAB — CYTOLOGY - PAP

## 2015-11-27 ENCOUNTER — Ambulatory Visit
Admission: RE | Admit: 2015-11-27 | Discharge: 2015-11-27 | Disposition: A | Discharge status: Home / Self Care | Payer: Commercial Managed Care - PPO | Source: Ambulatory Visit | Attending: Obstetrics & Gynecology | Admitting: Obstetrics & Gynecology

## 2015-11-27 DIAGNOSIS — E2839 Other primary ovarian failure: Secondary | ICD-10-CM

## 2016-07-03 ENCOUNTER — Other Ambulatory Visit: Payer: Self-pay | Admitting: Cardiovascular Disease

## 2016-07-07 NOTE — Telephone Encounter (Signed)
Rx(s) sent to pharmacy electronically.  

## 2016-08-28 ENCOUNTER — Other Ambulatory Visit: Payer: Self-pay | Admitting: Cardiovascular Disease

## 2016-08-29 NOTE — Telephone Encounter (Signed)
Rx has been sent to the pharmacy electronically. ° °

## 2016-10-05 ENCOUNTER — Other Ambulatory Visit: Payer: Self-pay | Admitting: Cardiovascular Disease

## 2016-11-30 ENCOUNTER — Other Ambulatory Visit: Payer: Self-pay | Admitting: Cardiovascular Disease

## 2017-01-29 IMAGING — RF DG UGI W/ HIGH DENSITY W/KUB
19 of 24 series · 19 of 24 positions shown · non-contrast
Comparison: Abdomen film of 11/04/2013

CLINICAL DATA: Heart current

EXAM:
UPPER GI SERIES WITH KUB
TECHNIQUE: After obtaining a scout radiograph a routine upper GI series was
performed using thin and high density barium.
FLUOROSCOPY TIME:  Radiation Exposure Index (as provided by the
fluoroscopic device): 57 Gy per sq cm
If the device does not provide the exposure index:
Fluoroscopy Time (in minutes and seconds):  3 minutes 12 seconds
Number of Acquired Images:

[Series 1: run · 1 of 1 slices shown (1 of 19)]
[im 1/1]
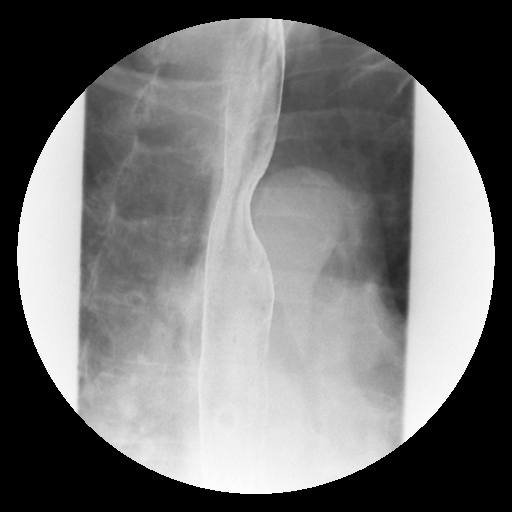

[Series 2: run · 1 of 1 slices shown (2 of 19)]
[im 1/1]
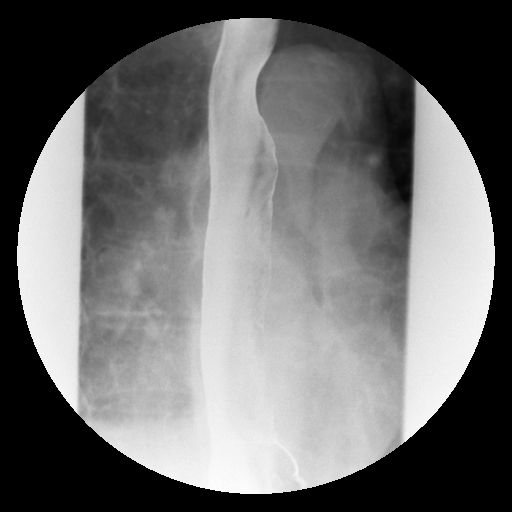

[Series 5: run · 1 of 1 slices shown (3 of 19)]
[im 1/1]
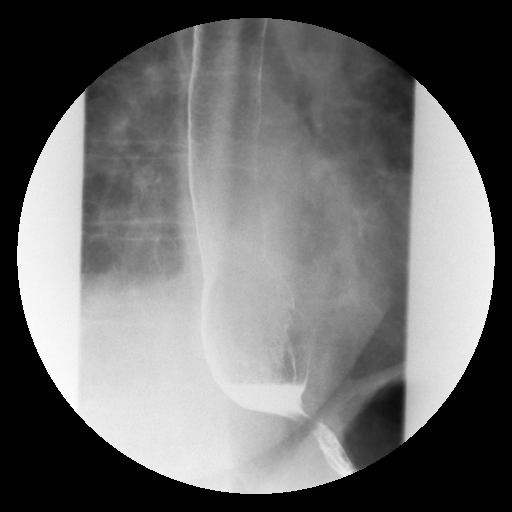

[Series 7: run · 1 of 1 slices shown (4 of 19)]
[im 1/1]
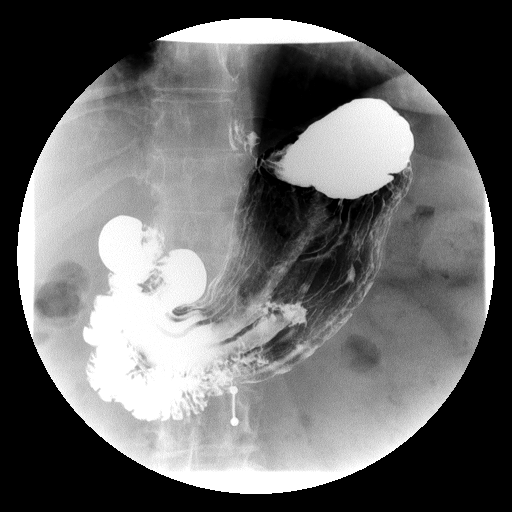

[Series 8: run · 1 of 1 slices shown (5 of 19)]
[im 1/1]
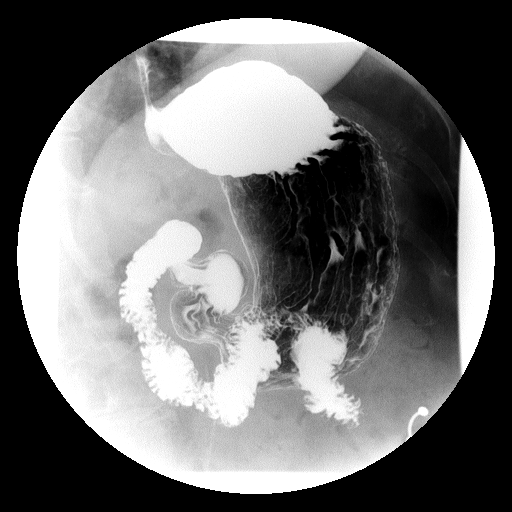

[Series 9: run · 1 of 1 slices shown (6 of 19)]
[im 1/1]
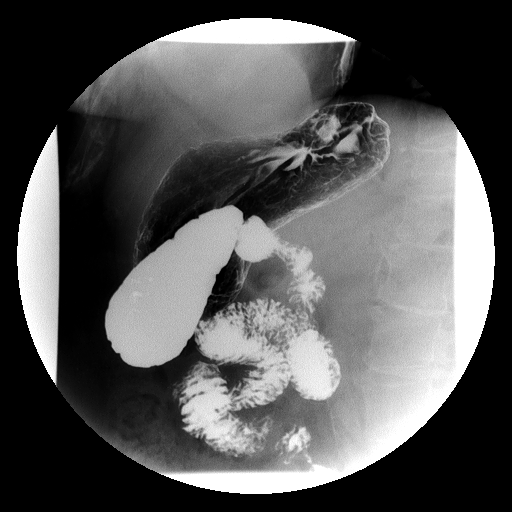

[Series 11: run · 1 of 1 slices shown (7 of 19)]
[im 1/1]
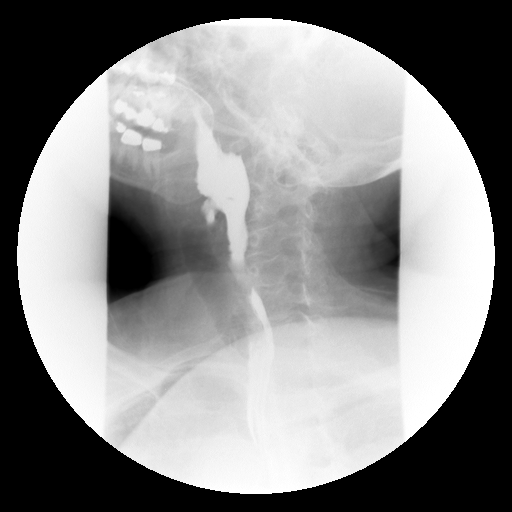

[Series 12: run · 1 of 1 slices shown (8 of 19)]
[im 1/1]
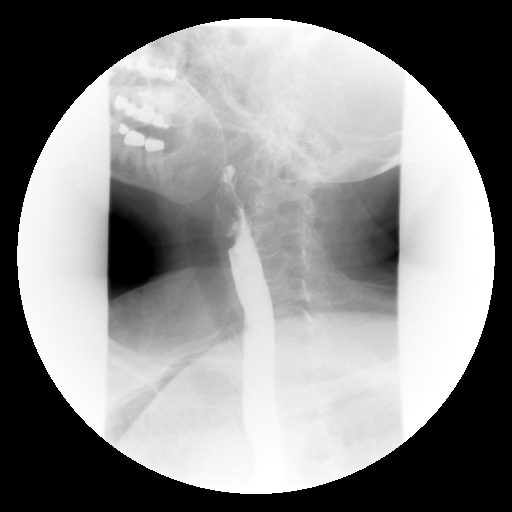

[Series 13: run · 1 of 1 slices shown (9 of 19)]
[im 1/1]
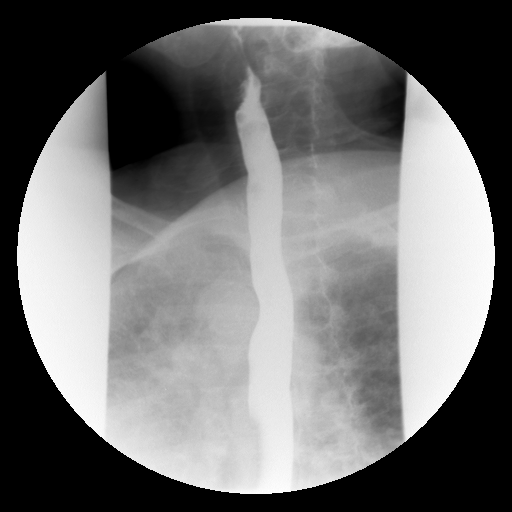

[Series 15: run · 1 of 1 slices shown (10 of 19)]
[im 1/1]
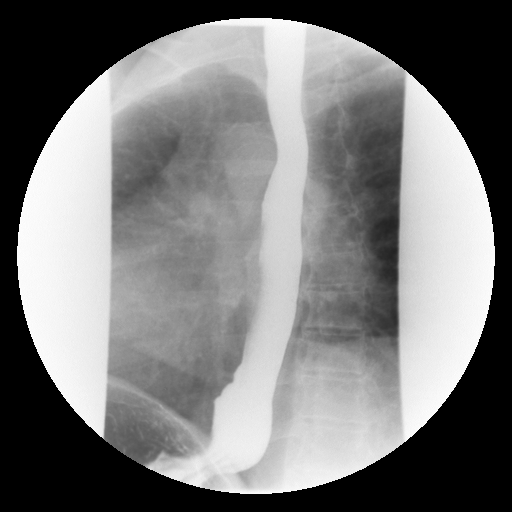

[Series 16: run · 1 of 1 slices shown (11 of 19)]
[im 1/1]
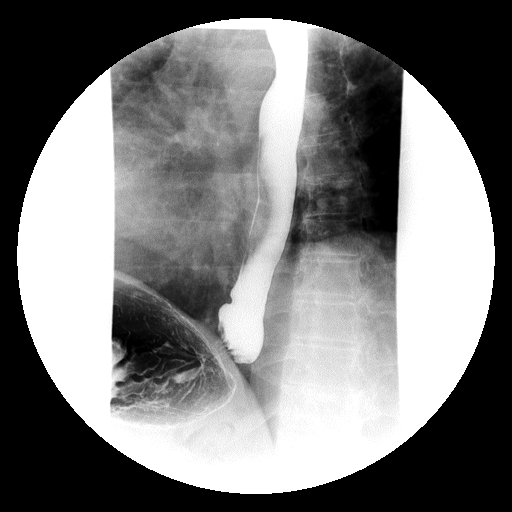

[Series 17: run · 1 of 1 slices shown (12 of 19)]
[im 1/1]
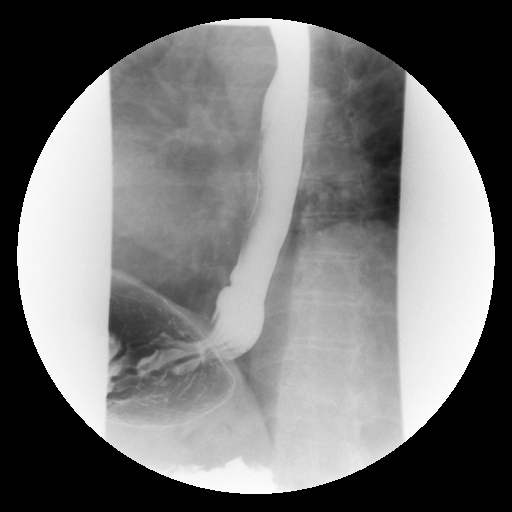

[Series 18: run · 1 of 1 slices shown (13 of 19)]
[im 1/1]
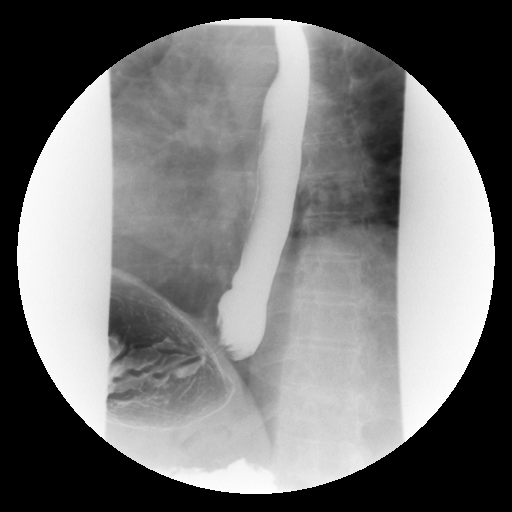

[Series 20: run · 1 of 1 slices shown (14 of 19)]
[im 1/1]
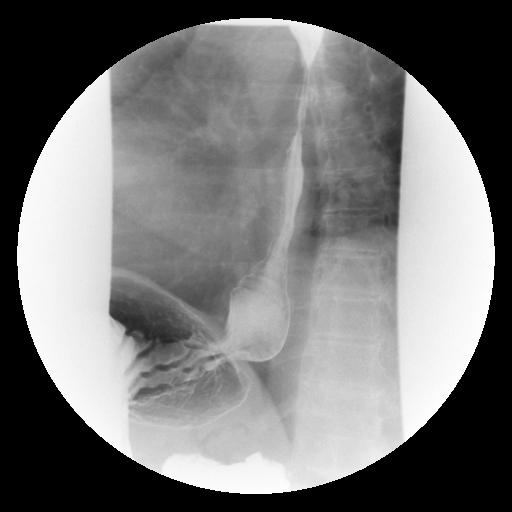

[Series 22: run · 1 of 1 slices shown (15 of 19)]
[im 1/1]
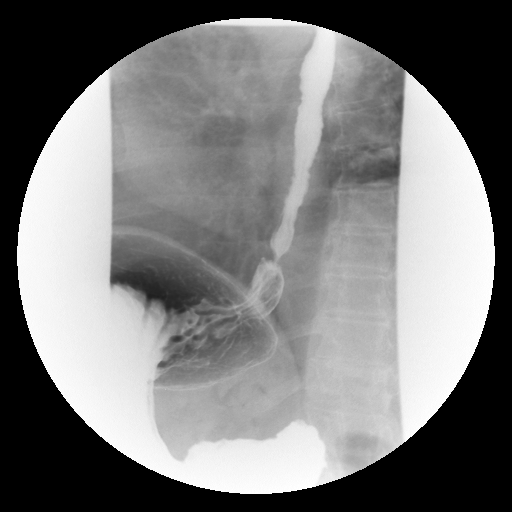

[Series 23: run · 1 of 1 slices shown (16 of 19)]
[im 1/1]
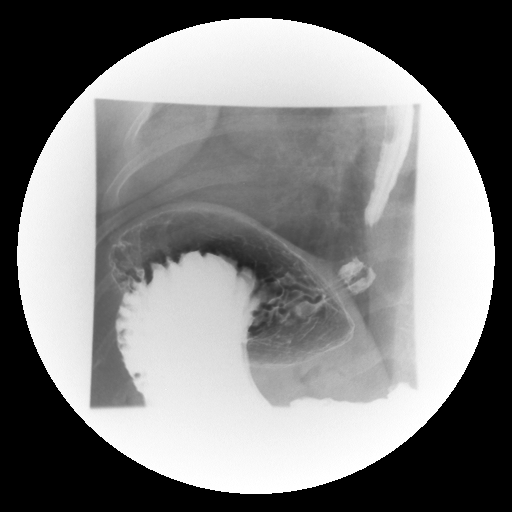

[Series 24: run · 1 of 1 slices shown (17 of 19)]
[im 1/1]
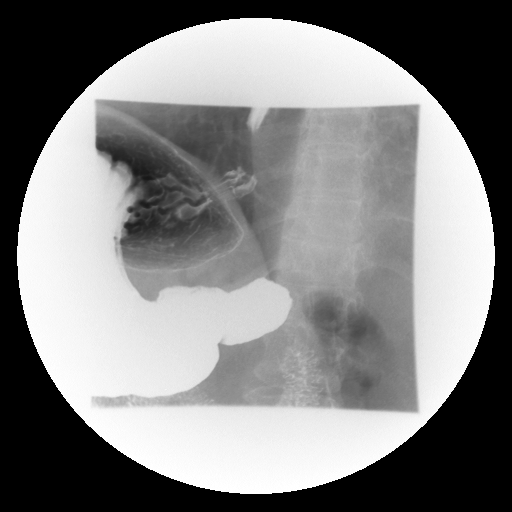

[Series 27: run · 1 of 1 slices shown (18 of 19)]
[im 1/1]
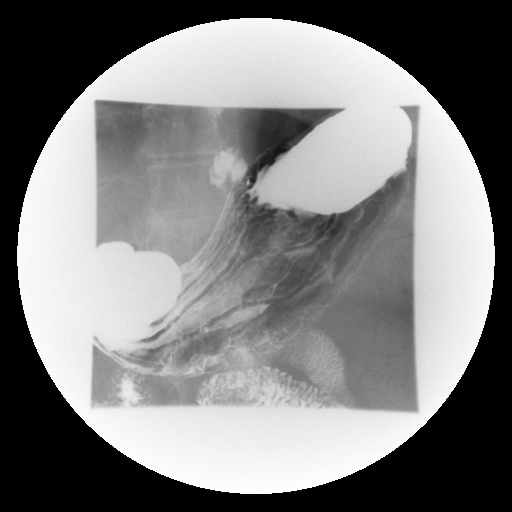

[Series 28: run · 1 of 1 slices shown (19 of 19)]
[im 1/1]
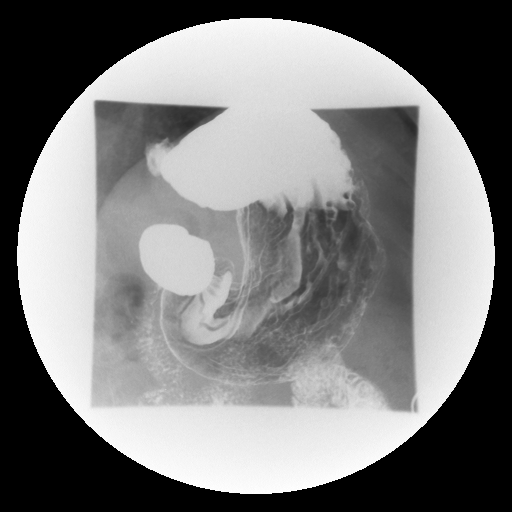

[19 of 24 positions shown; findings below may reference images not displayed]

FINDINGS: A preliminary film of the abdomen shows a nonspecific bowel gas
pattern. No opaque calculi are noted. There are degenerative changes
present the lower lumbar spine.

A double contrast study was performed. The mucosa of the esophagus
is unremarkable. A single contrast study shows the swallowing
mechanism to be normal. Esophageal peristalsis is normal. There is a
small hiatal hernia present. Moderate gastroesophageal reflux is
demonstrated. A barium pill was given at the end of the study which
passed into the stomach without delay.

The stomach is normal in contour and peristalsis. The duodenal bulb
fills and the duodenal loop is in normal position.
IMPRESSION: 1. Small hiatal hernia with moderate gastroesophageal reflux. Barium
pill passes into the stomach without delay.
2. The stomach and duodenum are unremarkable.
